# Patient Record
Sex: Male | Born: 1935 | Race: White | Hispanic: No | Marital: Married | Smoking: Former smoker
Health system: Southern US, Community
[De-identification: ages and names within clinical notes are randomized; demographics above are authoritative.]

## PROBLEM LIST (undated history)

## (undated) DIAGNOSIS — K279 Peptic ulcer, site unspecified, unspecified as acute or chronic, without hemorrhage or perforation: Secondary | ICD-10-CM

## (undated) DIAGNOSIS — N400 Enlarged prostate without lower urinary tract symptoms: Secondary | ICD-10-CM

## (undated) DIAGNOSIS — E785 Hyperlipidemia, unspecified: Secondary | ICD-10-CM

## (undated) DIAGNOSIS — G2581 Restless legs syndrome: Secondary | ICD-10-CM

## (undated) HISTORY — PX: CHOLECYSTECTOMY: SHX55

---

## 2014-10-09 ENCOUNTER — Inpatient Hospital Stay (HOSPITAL_COMMUNITY)
Admission: AD | Admit: 2014-10-09 | Discharge: 2014-10-10 | DRG: 378 | Disposition: A | Discharge status: Home / Self Care | Payer: Medicare Other | Source: Other Acute Inpatient Hospital | Attending: Internal Medicine | Admitting: Internal Medicine

## 2014-10-09 ENCOUNTER — Encounter (HOSPITAL_COMMUNITY): Payer: Self-pay | Admitting: Internal Medicine

## 2014-10-09 ENCOUNTER — Inpatient Hospital Stay (HOSPITAL_COMMUNITY): Payer: Medicare Other

## 2014-10-09 DIAGNOSIS — K2900 Acute gastritis without bleeding: Secondary | ICD-10-CM | POA: Diagnosis present

## 2014-10-09 DIAGNOSIS — E785 Hyperlipidemia, unspecified: Secondary | ICD-10-CM | POA: Diagnosis present

## 2014-10-09 DIAGNOSIS — K922 Gastrointestinal hemorrhage, unspecified: Secondary | ICD-10-CM | POA: Diagnosis present

## 2014-10-09 DIAGNOSIS — M170 Bilateral primary osteoarthritis of knee: Secondary | ICD-10-CM | POA: Diagnosis present

## 2014-10-09 DIAGNOSIS — N400 Enlarged prostate without lower urinary tract symptoms: Secondary | ICD-10-CM | POA: Diagnosis present

## 2014-10-09 DIAGNOSIS — K222 Esophageal obstruction: Secondary | ICD-10-CM | POA: Diagnosis present

## 2014-10-09 DIAGNOSIS — R000 Tachycardia, unspecified: Secondary | ICD-10-CM | POA: Diagnosis present

## 2014-10-09 DIAGNOSIS — G2581 Restless legs syndrome: Secondary | ICD-10-CM | POA: Diagnosis present

## 2014-10-09 DIAGNOSIS — D62 Acute posthemorrhagic anemia: Secondary | ICD-10-CM | POA: Diagnosis present

## 2014-10-09 DIAGNOSIS — Z87891 Personal history of nicotine dependence: Secondary | ICD-10-CM

## 2014-10-09 DIAGNOSIS — R109 Unspecified abdominal pain: Secondary | ICD-10-CM | POA: Diagnosis not present

## 2014-10-09 DIAGNOSIS — K259 Gastric ulcer, unspecified as acute or chronic, without hemorrhage or perforation: Secondary | ICD-10-CM | POA: Diagnosis present

## 2014-10-09 DIAGNOSIS — K2901 Acute gastritis with bleeding: Secondary | ICD-10-CM | POA: Diagnosis not present

## 2014-10-09 DIAGNOSIS — K274 Chronic or unspecified peptic ulcer, site unspecified, with hemorrhage: Principal | ICD-10-CM | POA: Diagnosis present

## 2014-10-09 DIAGNOSIS — Z9049 Acquired absence of other specified parts of digestive tract: Secondary | ICD-10-CM | POA: Diagnosis present

## 2014-10-09 HISTORY — DX: Benign prostatic hyperplasia without lower urinary tract symptoms: N40.0

## 2014-10-09 HISTORY — DX: Peptic ulcer, site unspecified, unspecified as acute or chronic, without hemorrhage or perforation: K27.9

## 2014-10-09 HISTORY — DX: Restless legs syndrome: G25.81

## 2014-10-09 HISTORY — DX: Hyperlipidemia, unspecified: E78.5

## 2014-10-09 LAB — TYPE AND SCREEN
ABO/RH(D): O POS
ANTIBODY SCREEN: NEGATIVE

## 2014-10-09 LAB — TROPONIN I: Troponin I: <0.03 ng/mL (ref <0.031)

## 2014-10-09 LAB — CBC
HEMATOCRIT: 30.8 % — AB (ref 39.0–52.0)
Hemoglobin: 10.1 g/dL — ABNORMAL LOW (ref 13.0–17.0)
MCH: 28.9 pg (ref 26.0–34.0)
MCHC: 32.8 g/dL (ref 30.0–36.0)
MCV: 88 fL (ref 78.0–100.0)
Platelets: 153 10*3/uL (ref 150–400)
RBC: 3.5 MIL/uL — ABNORMAL LOW (ref 4.22–5.81)
RDW: 19.1 % — ABNORMAL HIGH (ref 11.5–15.5)
WBC: 6.4 10*3/uL (ref 4.0–10.5)

## 2014-10-09 LAB — ABO/RH: ABO/RH(D): O POS

## 2014-10-09 LAB — COMPREHENSIVE METABOLIC PANEL
ALBUMIN: 3.3 g/dL — AB (ref 3.5–5.2)
ALT: 11 U/L (ref 0–53)
ANION GAP: 7 (ref 5–15)
AST: 20 U/L (ref 0–37)
Alkaline Phosphatase: 44 U/L (ref 39–117)
BILIRUBIN TOTAL: 0.6 mg/dL (ref 0.3–1.2)
BUN: 40 mg/dL — ABNORMAL HIGH (ref 6–23)
CO2: 27 mmol/L (ref 19–32)
Calcium: 8.8 mg/dL (ref 8.4–10.5)
Chloride: 105 mmol/L (ref 96–112)
Creatinine, Ser: 0.99 mg/dL (ref 0.50–1.35)
GFR calc Af Amer: 85 mL/min — ABNORMAL LOW (ref >90)
GFR calc non Af Amer: 74 mL/min — ABNORMAL LOW (ref >90)
Glucose, Bld: 132 mg/dL — ABNORMAL HIGH (ref 70–99)
Potassium: 3.9 mmol/L (ref 3.5–5.1)
Sodium: 139 mmol/L (ref 135–145)
TOTAL PROTEIN: 5.4 g/dL — AB (ref 6.0–8.3)

## 2014-10-09 LAB — PROTIME-INR
INR: 1.11 (ref 0.00–1.49)
PROTHROMBIN TIME: 14.4 s (ref 11.6–15.2)

## 2014-10-09 LAB — APTT: APTT: 34 s (ref 24–37)

## 2014-10-09 MED ORDER — ACETAMINOPHEN 325 MG PO TABS: 650.0000 mg | ORAL_TABLET | Freq: Four times a day (QID) | ORAL | Status: DC | PRN

## 2014-10-09 MED ORDER — ACETAMINOPHEN 650 MG RE SUPP: 650.0000 mg | Freq: Four times a day (QID) | RECTAL | Status: DC | PRN

## 2014-10-09 MED ORDER — SODIUM CHLORIDE 0.9 % IV SOLN
8.0000 mg/h | INTRAVENOUS | Status: DC
  Administered 2014-10-09 – 2014-10-10 (×2): 8 mg/h via INTRAVENOUS
  Filled 2014-10-09 (×4): qty 80

## 2014-10-09 MED ORDER — SODIUM CHLORIDE 0.9 % IV SOLN
INTRAVENOUS | Status: DC
  Administered 2014-10-09: 20:00:00 via INTRAVENOUS

## 2014-10-09 MED ORDER — SODIUM CHLORIDE 0.9 % IJ SOLN
3.0000 mL | Freq: Two times a day (BID) | INTRAMUSCULAR | Status: DC
  Administered 2014-10-10: 3 mL via INTRAVENOUS

## 2014-10-09 MED ORDER — PANTOPRAZOLE SODIUM 40 MG IV SOLR: 40.0000 mg | Freq: Two times a day (BID) | INTRAVENOUS | Status: DC

## 2014-10-09 MED ORDER — SODIUM CHLORIDE 0.9 % IV SOLN: INTRAVENOUS | Status: DC

## 2014-10-09 MED ORDER — SODIUM CHLORIDE 0.9 % IV SOLN
80.0000 mg | Freq: Once | INTRAVENOUS | Status: AC
  Administered 2014-10-09: 80 mg via INTRAVENOUS
  Filled 2014-10-09: qty 80

## 2014-10-09 MED ORDER — ONDANSETRON HCL 4 MG/2ML IJ SOLN: 4.0000 mg | Freq: Four times a day (QID) | INTRAMUSCULAR | Status: DC | PRN

## 2014-10-09 NOTE — H&P (Signed)
Shannon Rojas is an [age] y.o. male.    Dr. Thelma BargePerdahn (pcp)  Chief Complaint: gi bleeding HPI: 81 yo male with hx of PUD, apparently c/o blood in stool, black per pt starting last nite.  Slight cramping epigastric discomfort,  Pt takes iron pill.  His stool seemed darker than normal. Pt had gi bleeding last 06/2014.  Pt is not taking PPI regularly  Pt denies nsaid use other than meloxicam.  No GI in [place], and therefore [facility].     Past Medical History  Diagnosis Date  . Hyperlipidemia   . BPH (benign prostatic hyperplasia)   . Restless leg     Past Surgical History  Procedure Laterality Date  . Cholecystectomy      Family History  Problem Relation Age of Onset  . Hypertension Father    Social History:  reports that he quit smoking about 34 years ago. He does not have any smokeless tobacco history on file. He reports that he does not drink alcohol. His drug history is not on file.  Allergies: No Known Allergies   Medications reviewed  Pravastatin 20mg  po qhs Ropinirole 0.25mg  po qhs Meloxicam 7.5mg  po qday   No prescriptions prior to admission    No results found for this or any previous visit (from the past 48 hour(s)). No results found.  Review of Systems  Constitutional: Negative.   HENT: Negative.   Eyes: Negative.   Respiratory: Negative.   Cardiovascular: Negative.   Gastrointestinal: Positive for abdominal pain. Negative for heartburn, nausea, vomiting, diarrhea, constipation, blood in stool and melena.  Genitourinary: Negative.   Musculoskeletal: Negative.   Skin: Negative.   Neurological: Negative.   Endo/Heme/Allergies: Negative.   Psychiatric/Behavioral: Negative.     Blood pressure 128/70, pulse 103, temperature 98.2 F (36.8 C), temperature source Oral, resp. rate 20, height 5\' 9"  (1.753 m), weight 101.606 kg (224 lb), SpO2 100 %. Physical Exam  Constitutional: He is oriented to person, place, and time. He appears  well-developed and well-nourished.  HENT:  Head: Normocephalic and atraumatic.  Eyes: Conjunctivae and EOM are normal. Pupils are equal, round, and reactive to light. No scleral icterus.  Neck: Normal range of motion. Neck supple. No JVD present. No tracheal deviation present. No thyromegaly present.  Cardiovascular: Normal rate and regular rhythm.  Exam reveals no gallop and no friction rub.   No murmur heard. Respiratory: Effort normal and breath sounds normal. No respiratory distress. He has no wheezes. He has no rales. He exhibits no tenderness.  GI: Soft. Bowel sounds are normal. He exhibits no distension. There is no tenderness. There is no rebound and no guarding.  Musculoskeletal: Normal range of motion. He exhibits no edema or tenderness.  Lymphadenopathy:    He has no cervical adenopathy.  Neurological: He is alert and oriented to person, place, and time. He has normal reflexes. He displays normal reflexes. No cranial nerve deficit. He exhibits normal muscle tone. Coordination normal.  Skin: Skin is warm and dry. No rash noted. No erythema. No pallor.  Psychiatric: He has a normal mood and affect. His behavior is normal. Judgment and thought content normal.     Assessment/Plan ? GI bleeding NPO Type and screen protonix 80mg  iv x1, and then 8mg  per hour Gi consulted,   Tachycardia Tele Check cpk, mb, trop  ? Anemia Check cbc, cmp, pt, ptt  Hyperlipidemia Hold pravastatin for now.   PUD Stop meloxicam  DVT prophylaxis scd and teds  Shannon Rojas,  Shannon Rojas 10/09/2014, 7:01 PM

## 2014-10-09 NOTE — Consult Note (Signed)
Unassigned patient Reason for Consult: Black stools since yesterday. Referring Physician: THP  Shannon Rojas is an 81 y.o. male.  HPI: 81 year old white male, on Meloxicam for arthritis, developed melena yesterday with some epigastric pain. Denies having any abdominal pain today. He has a good appetite and his weight has been stable. He denies having any hematochezia, nausea, vomiting, dysphagia or odynophagia. He gives a history of having PUD in the past.. He has had a colonoscopy in the last 5 years that was reportedly normal.    Past Medical History  Diagnosis Date  . Hyperlipidemia   . BPH (benign prostatic hyperplasia)   . Restless leg   . PUD (peptic ulcer disease)    Past Surgical History  Procedure Laterality Date  . Cholecystectomy     Family History  Problem Relation Age of Onset  . Hypertension Father    Social History:  reports that he quit smoking about 34 years ago. He reports that he does not drink alcohol.   Allergies: No Known Allergies  Medications: I have reviewed the patient's current medications.  Review of Systems  Constitutional: Negative.   HENT: Negative.   Eyes: Negative.   Respiratory: Negative.   Cardiovascular: Negative.   Gastrointestinal: Positive for blood in stool.  Genitourinary: Negative.   Musculoskeletal: Positive for joint pain.  Skin: Negative.   Neurological: Negative.   Endo/Heme/Allergies: Negative.   Psychiatric/Behavioral: Negative.    Blood pressure 128/70, pulse 103, temperature 98.2 F (36.8 C), temperature source Oral, resp. rate 20, height 5\' 9"  (1.753 m), weight 101.606 kg (224 lb), SpO2 100 %. Physical Exam  Constitutional: He is oriented to person, place, and time. He appears well-developed and well-nourished.  HENT:  Head: Normocephalic and atraumatic.  Eyes: Conjunctivae and EOM are normal. Pupils are equal, round, and reactive to light.  Neck: Normal range of motion. Neck supple.  Cardiovascular: Normal rate  and regular rhythm.   Respiratory: Effort normal and breath sounds normal.  GI: Soft. Bowel sounds are normal.  Musculoskeletal: Normal range of motion.  Neurological: He is alert and oriented to person, place, and time.  Skin: Skin is warm and dry.  Psychiatric: He has a normal mood and affect. His behavior is normal. Judgment and thought content normal.   Assessment/Plan: 1) Melena on Meloxicam; will plan an EGD for tomorrow.  2) Hyperlipidemia.  3) BPH. 4) Restless leg syndrome.   Shannon Rojas 10/09/2014, 7:22 PM

## 2014-10-09 NOTE — Progress Notes (Signed)
Pt transfer from [place] via [facility]. A&Ox4. NS@100  Right AC. No c/o of pain. Awaiting orders.

## 2014-10-10 ENCOUNTER — Encounter (HOSPITAL_COMMUNITY)
Admission: AD | Disposition: A | Discharge status: Home / Self Care | Payer: Self-pay | Source: Other Acute Inpatient Hospital | Attending: Internal Medicine

## 2014-10-10 ENCOUNTER — Encounter (HOSPITAL_COMMUNITY): Payer: Self-pay

## 2014-10-10 DIAGNOSIS — K2901 Acute gastritis with bleeding: Secondary | ICD-10-CM

## 2014-10-10 DIAGNOSIS — D62 Acute posthemorrhagic anemia: Secondary | ICD-10-CM

## 2014-10-10 HISTORY — PX: ESOPHAGOGASTRODUODENOSCOPY: SHX5428

## 2014-10-10 LAB — CBC
HCT: 28.7 % — ABNORMAL LOW (ref 39.0–52.0)
HEMOGLOBIN: 9.5 g/dL — AB (ref 13.0–17.0)
MCH: 29.5 pg (ref 26.0–34.0)
MCHC: 33.1 g/dL (ref 30.0–36.0)
MCV: 89.1 fL (ref 78.0–100.0)
PLATELETS: 150 10*3/uL (ref 150–400)
RBC: 3.22 MIL/uL — ABNORMAL LOW (ref 4.22–5.81)
RDW: 19.3 % — AB (ref 11.5–15.5)
WBC: 6.4 10*3/uL (ref 4.0–10.5)

## 2014-10-10 SURGERY — EGD (ESOPHAGOGASTRODUODENOSCOPY)
Anesthesia: Moderate Sedation | Laterality: Left

## 2014-10-10 MED ORDER — PANTOPRAZOLE SODIUM 40 MG PO TBEC
40.0000 mg | DELAYED_RELEASE_TABLET | Freq: Two times a day (BID) | ORAL | Status: DC
  Administered 2014-10-10: 40 mg via ORAL
  Filled 2014-10-10: qty 1

## 2014-10-10 MED ORDER — SUCRALFATE 1 G PO TABS: 1.0000 g | ORAL_TABLET | Freq: Three times a day (TID) | ORAL | Status: AC

## 2014-10-10 MED ORDER — MIDAZOLAM HCL 5 MG/ML IJ SOLN
INTRAMUSCULAR | Status: AC
  Filled 2014-10-10: qty 2

## 2014-10-10 MED ORDER — PANTOPRAZOLE SODIUM 40 MG PO TBEC: 40.0000 mg | DELAYED_RELEASE_TABLET | Freq: Two times a day (BID) | ORAL | Status: AC

## 2014-10-10 MED ORDER — FENTANYL CITRATE 0.05 MG/ML IJ SOLN
INTRAMUSCULAR | Status: DC | PRN
  Administered 2014-10-10: 12.5 ug via INTRAVENOUS
  Administered 2014-10-10: 25 ug via INTRAVENOUS
  Administered 2014-10-10: 12.5 ug via INTRAVENOUS

## 2014-10-10 MED ORDER — MIDAZOLAM HCL 10 MG/2ML IJ SOLN
INTRAMUSCULAR | Status: DC | PRN
  Administered 2014-10-10 (×2): 2 mg via INTRAVENOUS
  Administered 2014-10-10: 1 mg via INTRAVENOUS

## 2014-10-10 MED ORDER — FENTANYL CITRATE 0.05 MG/ML IJ SOLN
INTRAMUSCULAR | Status: AC
  Filled 2014-10-10: qty 2

## 2014-10-10 MED ORDER — BUTAMBEN-TETRACAINE-BENZOCAINE 2-2-14 % EX AERO
INHALATION_SPRAY | CUTANEOUS | Status: DC | PRN
  Administered 2014-10-10: 2 via TOPICAL

## 2014-10-10 NOTE — Progress Notes (Signed)
NURSING PROGRESS NOTE  Melvern BankerRandolph Garmany [id number] Discharge Data: 10/10/2014 3:25 PM Attending Provider: Maretta BeesShanker M Ghimire, MD YNW:GNFAOZH,YQMVHQIPCP:PRADHAN,PRADEEP K, MD   Melvern Bankerandolph Behrman to be D/C'd Home per MD order.    All IV's will be discontinued and monitored for bleeding.  All belongings will be returned to patient for patient to take home.  Last Documented Vital Signs:  Blood pressure 115/65, pulse 81, temperature 98 F (36.7 C), temperature source Oral, resp. rate 20, height 5\' 9"  (1.753 m), weight 97.251 kg (214 lb 6.4 oz), SpO2 100 %.  Leane PlattSpencer Amere Iott RN, BS, BSN

## 2014-10-10 NOTE — Discharge Summary (Addendum)
PATIENT DETAILS Name: Shannon Rojas Age: 81 y.o. Sex: male Date of Birth: November 09, 1929 MRN: [id number]. Admitting Physician: Maretta Bees, MD WUJ:WJXBJYN,WGNFAOZ K, MD  Admit Date: 10/09/2014 Discharge date: 10/10/2014  Recommendations for Outpatient Follow-up:  1. Please check CBC at next visit 2. Carafate 3 times a day for 2 weeks and then stop 3. Follow biopsy results (done during EGD)  PRIMARY DISCHARGE DIAGNOSIS:  Active Problems:   GI bleeding   Abdominal pain      PAST MEDICAL HISTORY: Past Medical History  Diagnosis Date  . Hyperlipidemia   . BPH (benign prostatic hyperplasia)   . Restless leg   . PUD (peptic ulcer disease)     DISCHARGE MEDICATIONS: Current Discharge Medication List    START taking these medications   Details  pantoprazole (PROTONIX) 40 MG tablet Take 1 tablet (40 mg total) by mouth 2 (two) times daily. Qty: 60 tablet, Refills: 0    sucralfate (CARAFATE) 1 G tablet Take 1 tablet (1 g total) by mouth 3 (three) times daily. Qty: 42 tablet, Refills: 0      CONTINUE these medications which have NOT CHANGED   Details  Cholecalciferol (VITAMIN D) 2000 UNITS CAPS Take 1 capsule by mouth daily.     doxazosin (CARDURA) 4 MG tablet Take 4 mg by mouth daily.    ferrous sulfate 325 (65 FE) MG tablet Take 325 mg by mouth daily with breakfast.    pravastatin (PRAVACHOL) 40 MG tablet Take 40 mg by mouth daily.    rOPINIRole (REQUIP) 0.25 MG tablet Take 0.25 mg by mouth every evening.      STOP taking these medications     meloxicam (MOBIC) 7.5 MG tablet         ALLERGIES:  No Known Allergies  BRIEF HPI:  See H&P, Labs, Consult and Test reports for all details in brief, patient is a 70 or male with prior history of peptic ulcer disease, still taking meloxicam for osteoarthritis of his bilateral knee, presented to [facility] with melena. Patient was then transferred to [facility] for further evaluation and  treatment.  CONSULTATIONS:   GI  PERTINENT RADIOLOGIC STUDIES: Dg Abd 2 Views  10/09/2014   CLINICAL DATA:  81 year old male with history of recent dark stools and abdominal pain.  EXAM: ABDOMEN - 2 VIEW  COMPARISON:  No priors.  FINDINGS: Surgical clips project over the right upper quadrant of the abdomen, likely from prior cholecystectomy. Gas and stool are noted throughout the colon extending to the level of the distal rectum. No pathologic dilatation of small bowel. Multiple nondilated gas-filled loops of small bowel are noted throughout the central abdomen, nonspecific. No pneumoperitoneum. Multiple pelvic phleboliths are noted.  IMPRESSION: 1. Nonspecific, nonobstructive bowel gas pattern. 2. No pneumoperitoneum. 3. Surgical clips in the right upper quadrant of the abdomen, likely from prior cholecystectomy.   Electronically Signed   By: Trudie Reed M.D.   On: 10/09/2014 21:00     PERTINENT LAB RESULTS: CBC:  Recent Labs  10/09/14 2016 10/10/14 0536  WBC 6.4 6.4  HGB 10.1* 9.5*  HCT 30.8* 28.7*  PLT 153 150   CMET CMP     Component Value Date/Time   NA 139 10/09/2014 2016   K 3.9 10/09/2014 2016   CL 105 10/09/2014 2016   CO2 27 10/09/2014 2016   GLUCOSE 132* 10/09/2014 2016   BUN 40* 10/09/2014 2016   CREATININE 0.99 10/09/2014 2016   CALCIUM 8.8 10/09/2014 2016  PROT 5.4* 10/09/2014 2016   ALBUMIN 3.3* 10/09/2014 2016   AST 20 10/09/2014 2016   ALT 11 10/09/2014 2016   ALKPHOS 44 10/09/2014 2016   BILITOT 0.6 10/09/2014 2016   GFRNONAA 74* 10/09/2014 2016   GFRAA 85* 10/09/2014 2016    GFR Estimated Creatinine Clearance: 65 mL/min (by C-G formula based on Cr of 0.99). No results for input(s): LIPASE, AMYLASE in the last 72 hours.  Recent Labs  10/09/14 2016  TROPONINI <0.03   Invalid input(s): POCBNP No results for input(s): DDIMER in the last 72 hours. No results for input(s): HGBA1C in the last 72 hours. No results for input(s): CHOL, HDL,  LDLCALC, TRIG, CHOLHDL, LDLDIRECT in the last 72 hours. No results for input(s): TSH, T4TOTAL, T3FREE, THYROIDAB in the last 72 hours.  Invalid input(s): FREET3 No results for input(s): VITAMINB12, FOLATE, FERRITIN, TIBC, IRON, RETICCTPCT in the last 72 hours. Coags:  Recent Labs  10/09/14 2016  INR 1.11   Microbiology: No results found for this or any previous visit (from the past 240 hour(s)).   BRIEF HOSPITAL COURSE:  Upper GI bleeding: Secondary to peptic ulcer disease. Patient was admitted, started on PPI. GI consulted, patient underwent EGD on 3/26 which showed a small antral ulcer with a hemocyte export in the middle. Biopsies were obtained, currently pending at the time of discharge. This M.D., spoke of gastroenterology-Dr. Lewayne Bunting recommended discharge on PPI and 2 weeks off Carafate. This was discussed with patient, he was agreeable.patient to follow-up with his primary care practitioner in one week for repeat CBC. Patient was counseled, regarding avoidance of all nonsteroidal anti-inflammatory medications including meloxicam.  Mild acute blood loss anemia: Patient presented with a hemoglobin of 10.1, this morning, hemoglobin has slightly decreased to 9.5. Likely secondary to IV dye douche and a rather than active bleeding. Patient will be on the above-noted medications, will be followed by PCP within a week for repeat CBC.  Sinus tachycardia: Resolved.  -DAY OF DISCHARGE:  Subjective:   Shannon Rojas today has no headache,no chest abdominal pain,no new weakness tingling or numbness, feels much better wants to go home today.   Objective:   Blood pressure 115/65, pulse 81, temperature 98 F (36.7 C), temperature source Oral, resp. rate 20, height  (1.753 m), weight 97.251 kg (214 lb 6.4 oz), SpO2 100 %.  Intake/Output Summary (Last 24 hours) at 10/10/14 1416 Last data filed at 10/10/14 1312  Gross per 24 hour  Intake 1060.83 ml  Output   2400 ml  Net -1339.17  ml   Filed Weights   10/09/14 1849 10/10/14 0526  Weight: 101.606 kg (224 lb) 97.251 kg (214 lb 6.4 oz)    Exam Awake Alert, Oriented *3, No new F.N deficits, Normal affect [place].AT,PERRAL Supple Neck,No JVD, No cervical lymphadenopathy appriciated.  Symmetrical Chest wall movement, Good air movement bilaterally, CTAB RRR,No Gallops,Rubs or new Murmurs, No Parasternal Heave +ve B.Sounds, Abd Soft, Non tender, No organomegaly appriciated, No rebound -guarding or rigidity. No Cyanosis, Clubbing or edema, No new Rash or bruise  DISCHARGE CONDITION: Stable  DISPOSITION: Home  DISCHARGE INSTRUCTIONS:    Activity:  As tolerated   Diet recommendation: Heart Healthy diet  Discharge Instructions    Call MD for:  difficulty breathing, headache or visual disturbances    Complete by:  As directed      Call MD for:  extreme fatigue    Complete by:  As directed      Diet - low sodium heart  healthy    Complete by:  As directed      Increase activity slowly    Complete by:  As directed            Follow-up Information    [facility]. Schedule an appointment as soon as possible for a visit in 1 week.   Specialty:  Internal Medicine   Why:  please ask your MD to check a CBC   Contact information:   [address], [place] [id number] [phone]      Total Time spent on discharge equals 25 minutes.  SignedJeoffrey Massed: GHIMIRE,SHANKER 10/10/2014 2:16 PM

## 2014-10-10 NOTE — Op Note (Signed)
[facility] [address] [place], [id number]   OPERATIVE PROCEDURE REPORT  PATIENT :Melvern BankerDickens, Ori  MR#: [id number] BIRTHDATE :1929-12-19 GENDER: male ENDOSCOPIST: Lorenza BurtonJyothi N Jahn Franchini, MD ASSISTANT:   Nilsa NuttingFelicia Akande, technician & Omelia BlackwaterShelby Carpenter, RN PROCEDURE DATE: 10/10/2014 PRE-PROCEDURE PREPERATION: Patient fasted for 8 hours prior to the procedure. PRE-PROCEDURE PHYSICAL: Patient has stable vital signs.  Neck is supple.  There is no JVD, thyromegaly or LAD.  Chest clear to auscultation.  S1 and S2 regular.  Abdomen soft, non-distended, non-tender with NABS. PROCEDURE:     EGD with cold biopsie x 2. ASA CLASS:     Class II INDICATIONS:     1) Melena 2) Acute post hemorrhagic anemia. MEDICATIONS:     Fentanyl 50 mcg IV and Versed 5 mg IV TOPICAL ANESTHETIC:   Cetacaine spray  DESCRIPTION OF PROCEDURE: After the risks benefits and alternatives of the procedure were thoroughly explained, informed consent was obtained.  The Pentax Gastroscope [id number]  was introduced through the mouth and advanced to the second portion of the duodenum , without limitations. The instrument was slowly withdrawn as the mucosa was fully examined. Estimated blood loss is zero unless otherwise noted in this procedure report.      ESOPHAGUS: The mucosa of the esophagus and the GEJ appeared normal; there was a wide open Schatzki's ring noted at the GEJ.  STOMACH: A small antral ulcer with a hemocytic spot in the middle of it was noted in the antrum with severe edema and gastritis in the gastric mucosa surrounding the ulcer; biopsies were done to rule out H. pylori by pathology ; the rest of the stomach appeared normal.  DUODENUM: The duodenal mucosa showed no abnormalities.  Retroflexed views revealed no abnormalities. The scope was then withdrawn from the patient and the procedure terminated. The patient tolerated the procedure without immediate  complications.  IMPRESSION:  1) Wide open Schatzki's ring noted at the GEJ. 2) One small antral ulcer with a hemocystic spot noted-biopsies done for H. pylori. 3) Severe antral gastritis. 4) Normal proximal small bowel.  RECOMMENDATIONS:     1.  Anti-reflux regimen to be followed. 2.  Avoid all NSAIDS INCLUDING MELOXICAM. 3.  Await pathology results. 4.  Continue current medications. 5.  Continue PPI-change to PO PPI's. 6.  Carafate ssupension 1 gm TID PO for 2 weeks. 7. OP follow-up in 2 weeks with primary MD.  REPEAT EXAM:  No recall or repeat exam necessary at this time . DISCHARGE INSTRUCTIONS: standard discharge instructions given.  _______________________________ eSignedLorenza Burton:  Torian Thoennes N Deontez Klinke, MD 10/10/2014 11:11 AM   CPT CODES:     [phone] Upper gastrointestinal endoscopy including esophagus, stomach, and either the duodenum and/or jejunum as appropriate; with biopsy, single or multiple DIAGNOSIS CODES:     K92.1 Melena D62 Acute posthemorrhagic anemia K29.00 Acute gastritis, K25.4 Gastric ulcer  The ICD and CPT codes recommended by this software are interpretations from the data that the clinical staff has captured with the software.  The verification of the translation of this report to the ICD and CPT codes and modifiers is the sole responsibility of the health care institution and practicing physician where this report was generated.  [employer] will not be held responsible for the validity of the ICD and CPT codes included on this report.  AMA assumes no liability for data contained or not contained herein. CPT is a Publishing rights managerregistered trademark of the [employer].   CC: THP  PATIENT  NAME:  Rosbel, Buckner MR#: [id number]

## 2014-10-10 NOTE — Progress Notes (Signed)
Patient ready for d/c, awaiting arrival of family for pick up, family currently not answering phone.

## 2014-10-12 ENCOUNTER — Encounter (HOSPITAL_COMMUNITY): Payer: Self-pay | Admitting: Gastroenterology

## 2015-12-17 IMAGING — CR DG ABDOMEN 2V
2 series · 2 of 2 positions shown · non-contrast
Comparison: No priors.

CLINICAL DATA: 85-year-old male with history of recent dark stools
and abdominal pain.

EXAM:
ABDOMEN - 2 VIEW

[abdomen erect]
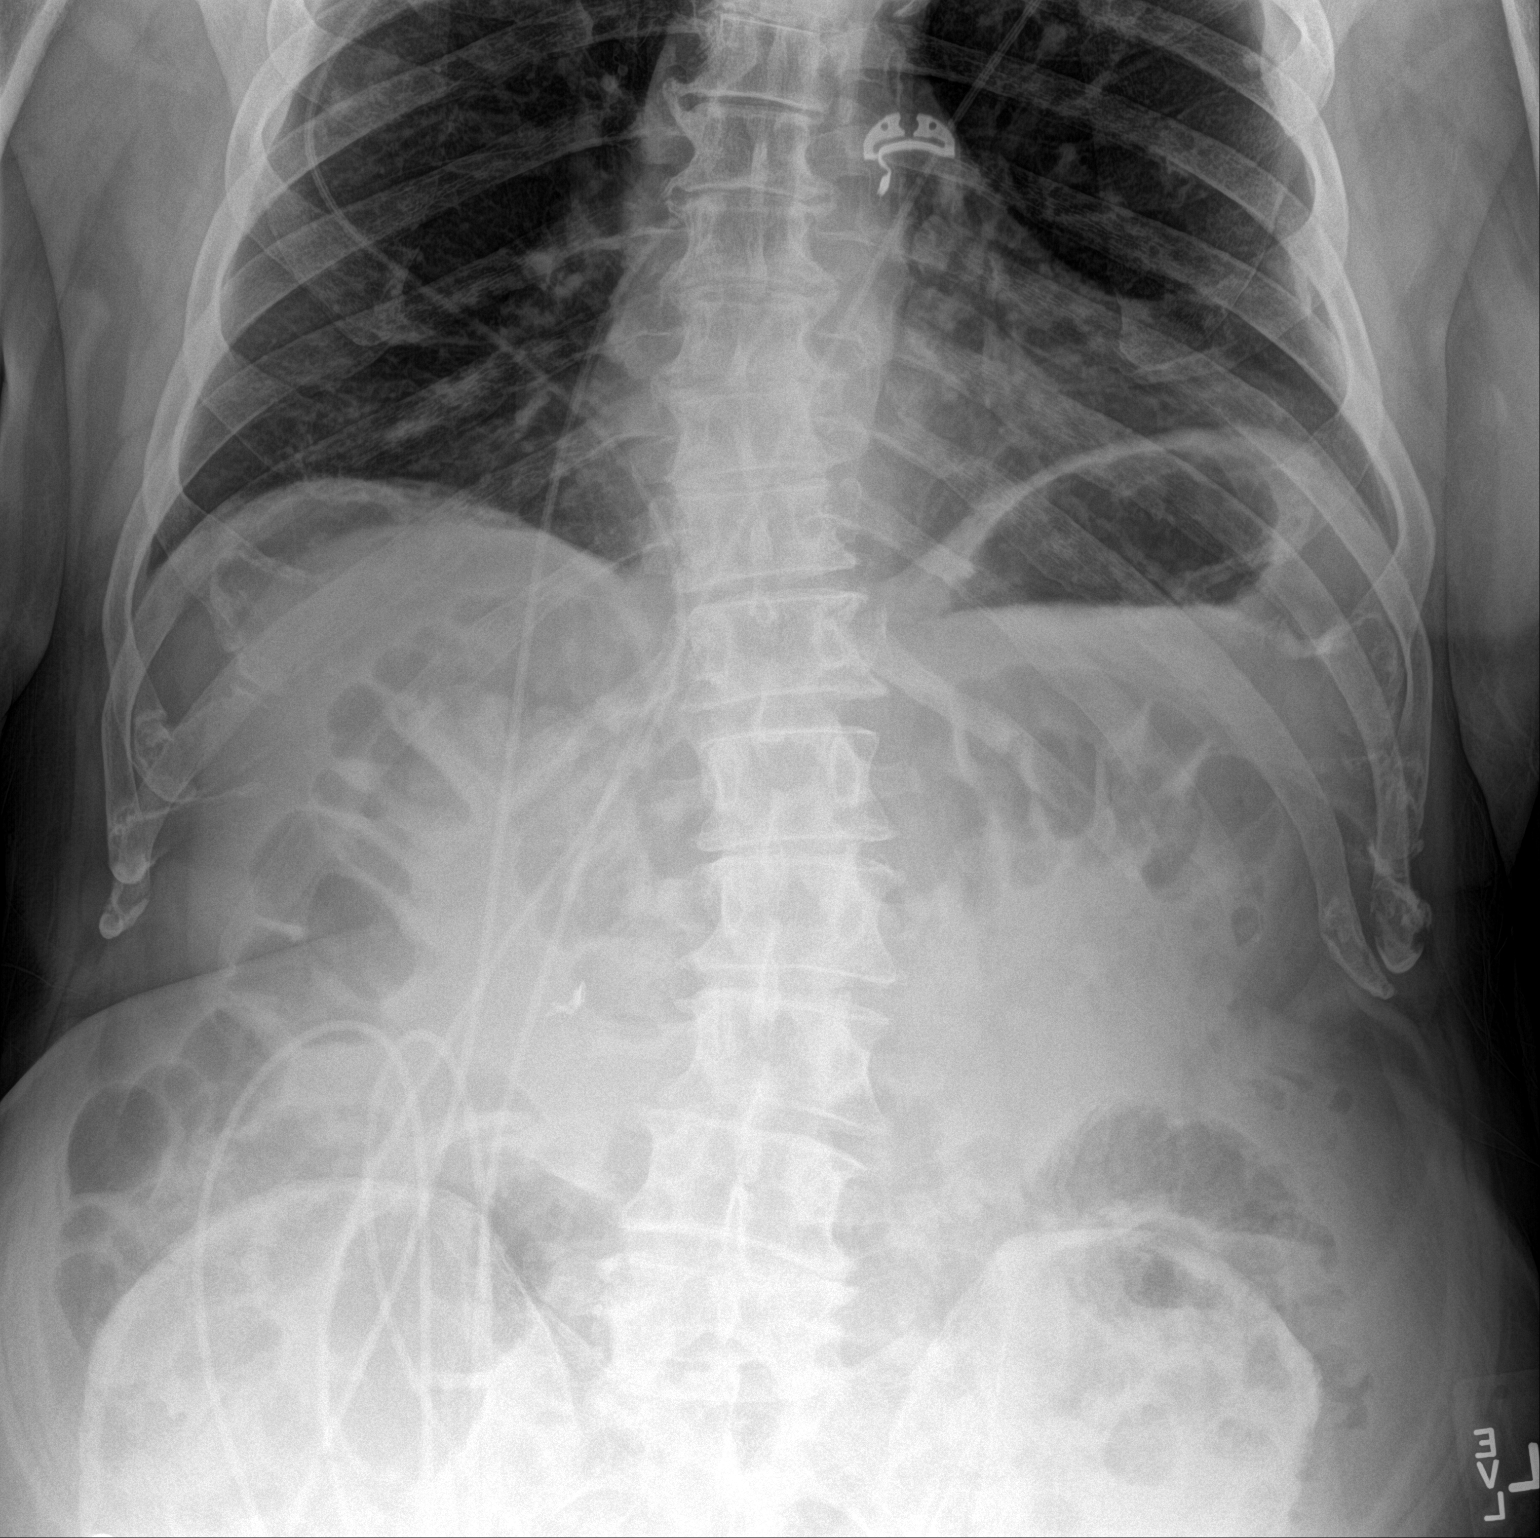

[abdomen supine]
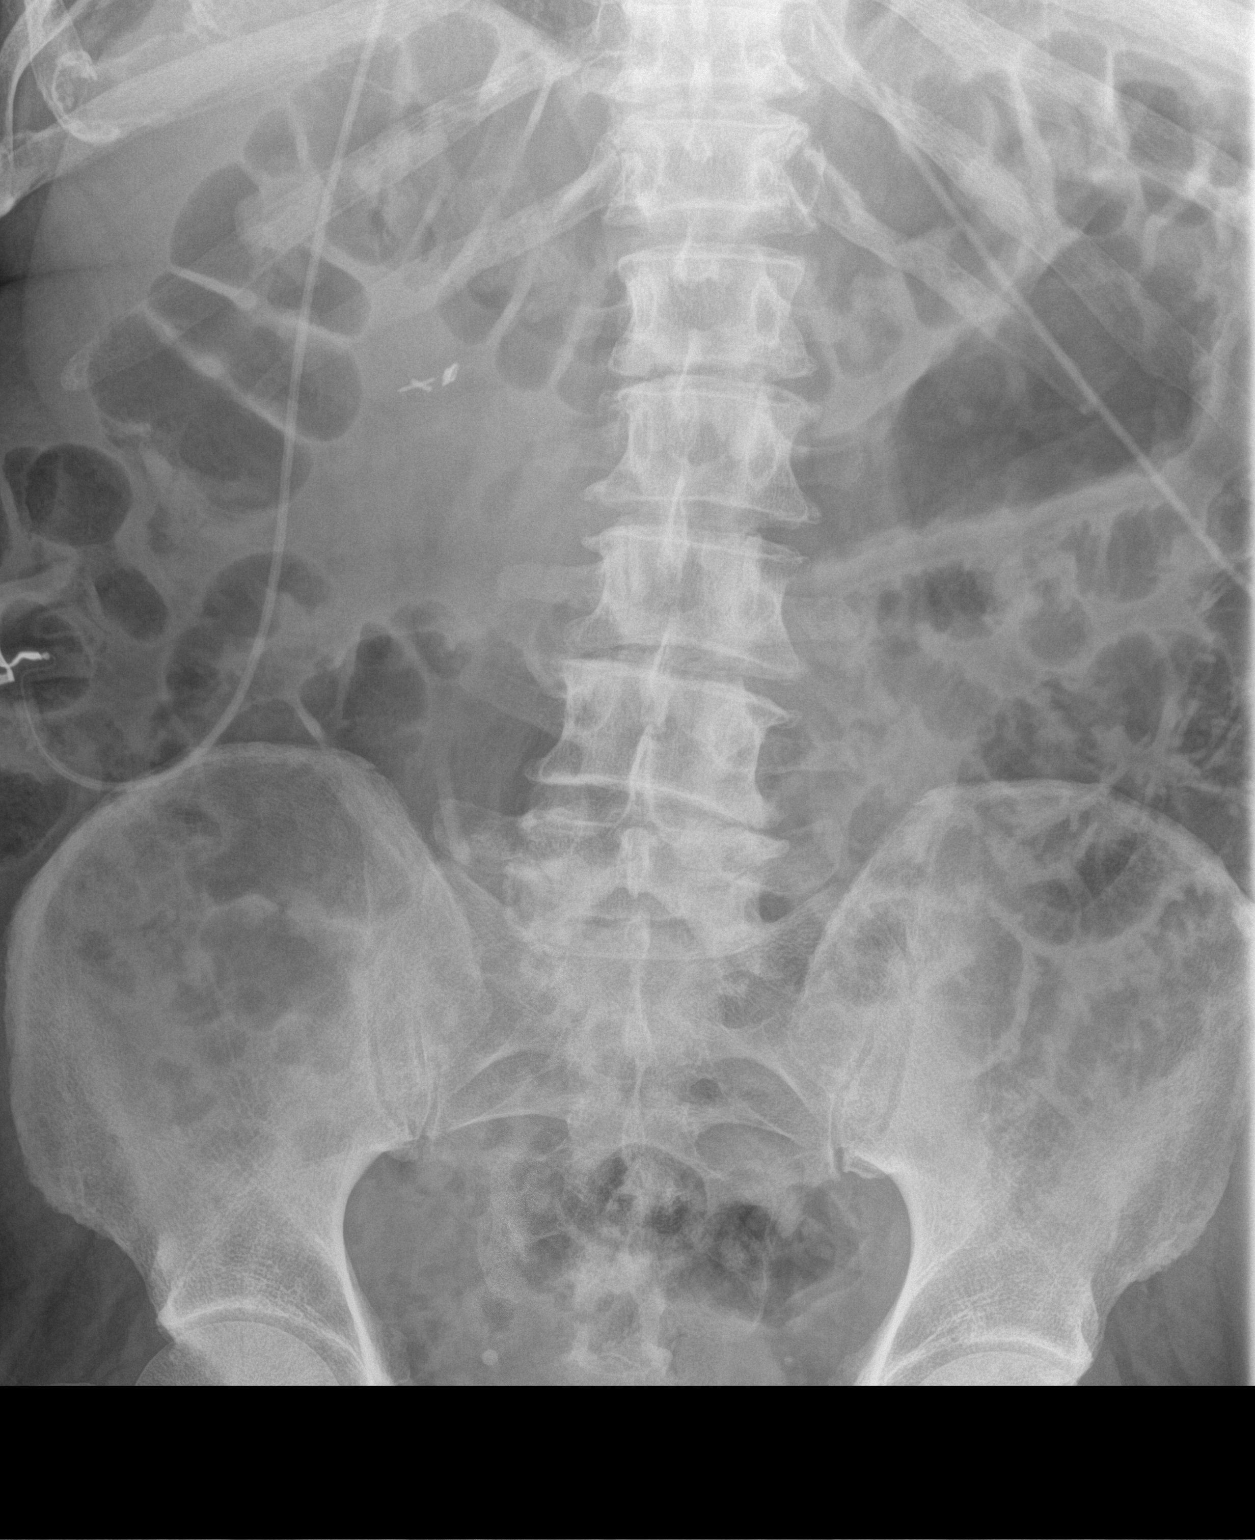

[2 of 2 positions shown; findings below may reference images not displayed]

FINDINGS: Surgical clips project over the right upper quadrant of the abdomen,
likely from prior cholecystectomy. Gas and stool are noted
throughout the colon extending to the level of the distal rectum. No
pathologic dilatation of small bowel. Multiple nondilated gas-filled
loops of small bowel are noted throughout the central abdomen,
nonspecific. No pneumoperitoneum. Multiple pelvic phleboliths are
noted.
IMPRESSION: 1. Nonspecific, nonobstructive bowel gas pattern.
2. No pneumoperitoneum.
3. Surgical clips in the right upper quadrant of the abdomen, likely
from prior cholecystectomy.
